# Patient Record
Sex: Male | Born: 2005 | Hispanic: Yes | Marital: Single | Smoking: Never smoker
Health system: Southern US, Community
[De-identification: ages and names within clinical notes are randomized; demographics above are authoritative.]

---
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
## 2024-02-04 ENCOUNTER — Encounter: Payer: Self-pay | Admitting: Emergency Medicine

## 2024-02-04 ENCOUNTER — Other Ambulatory Visit: Payer: Self-pay

## 2024-02-04 ENCOUNTER — Emergency Department
Admission: EM | Admit: 2024-02-04 | Discharge: 2024-02-04 | Disposition: A | Discharge status: Home / Self Care | Payer: Medicaid Other | Attending: Emergency Medicine | Admitting: Emergency Medicine

## 2024-02-04 DIAGNOSIS — R002 Palpitations: Secondary | ICD-10-CM | POA: Insufficient documentation

## 2024-02-04 DIAGNOSIS — D72829 Elevated white blood cell count, unspecified: Secondary | ICD-10-CM | POA: Insufficient documentation

## 2024-02-04 LAB — CBC WITH DIFFERENTIAL/PLATELET
Abs Immature Granulocytes: 0.04 10*3/uL (ref 0.00–0.07)
Basophils Absolute: 0 10*3/uL (ref 0.0–0.1)
Basophils Relative: 0 %
Eosinophils Absolute: 0.1 10*3/uL (ref 0.0–0.5)
Eosinophils Relative: 1 %
HCT: 42.9 % (ref 39.0–52.0)
Hemoglobin: 14.6 g/dL (ref 13.0–17.0)
Immature Granulocytes: 0 %
Lymphocytes Relative: 21 %
Lymphs Abs: 2.7 10*3/uL (ref 0.7–4.0)
MCH: 31.3 pg (ref 26.0–34.0)
MCHC: 34 g/dL (ref 30.0–36.0)
MCV: 92.1 fL (ref 80.0–100.0)
Monocytes Absolute: 0.9 10*3/uL (ref 0.1–1.0)
Monocytes Relative: 7 %
Neutro Abs: 9.2 10*3/uL — ABNORMAL HIGH (ref 1.7–7.7)
Neutrophils Relative %: 71 %
Platelets: 322 10*3/uL (ref 150–400)
RBC: 4.66 MIL/uL (ref 4.22–5.81)
RDW: 11.9 % (ref 11.5–15.5)
WBC: 13 10*3/uL — ABNORMAL HIGH (ref 4.0–10.5)
nRBC: 0 % (ref 0.0–0.2)

## 2024-02-04 LAB — BASIC METABOLIC PANEL
Anion gap: 12 (ref 5–15)
BUN: 17 mg/dL (ref 6–20)
CO2: 24 mmol/L (ref 22–32)
Calcium: 9.5 mg/dL (ref 8.9–10.3)
Chloride: 102 mmol/L (ref 98–111)
Creatinine, Ser: 0.93 mg/dL (ref 0.61–1.24)
GFR, Estimated: >60 mL/min (ref >60)
Glucose, Bld: 96 mg/dL (ref 70–99)
Potassium: 3.8 mmol/L (ref 3.5–5.1)
Sodium: 138 mmol/L (ref 135–145)

## 2024-02-04 LAB — TROPONIN I (HIGH SENSITIVITY): Troponin I (High Sensitivity): <2 ng/L (ref <18)

## 2024-02-04 NOTE — ED Triage Notes (Addendum)
Pt via POV from home. Pt c/o sudden onset of L sided non-radiating CP, palpitations, and SOB. Reports that he had 20 mins of CP PTA but denies any pain at this time. Continues to have palpitations. States he started taking Tramadol the night before for a previous injury but states he did not have these symptoms the first time he took it. Denies cardiac hx. Pt is A&Ox4 and NAD, ambulatory to room.

## 2024-02-04 NOTE — Discharge Instructions (Signed)
Please continue to monitor your symptoms at home.  Laboratory workup was reassuring today.  Please follow-up with your primary care provider as needed

## 2024-02-04 NOTE — ED Provider Notes (Signed)
[facility] Provider Note    Event Date/Time   First MD Initiated Contact with Patient 02/04/24 [phone]     (approximate)   History   Palpitations   HPI Jeffrey Griffith is a 18 y.o. male with history of depression and anxiety presenting today for palpitations.  Patient states he was taking tramadol to help with back pain that he was prescribed.  Afterwards felt like he was having palpitations and his heart was racing.  Felt very anxious and thought he was having shortness of breath.  Symptoms are very self-limited and have resolved before he got here.  Denied nausea, vomiting, abdominal pain.  Denies any symptoms similar to this.  Thinks he was having a panic attack.  No prior history of anxiety but chart review does show prior history of depression.     Physical Exam   Triage Vital Signs: ED Triage Vitals  Encounter Vitals Group     BP      Systolic BP Percentile      Diastolic BP Percentile      Pulse      Resp      Temp      Temp src      SpO2      Weight      Height      Head Circumference      Peak Flow      Pain Score      Pain Loc      Pain Education      Exclude from Growth Chart     Most recent vital signs: Vitals:   02/04/24 0430 02/04/24 0500  BP: (!) 144/86   Pulse: 93 89  Resp: (!) 28 (!) 22  Temp:    SpO2: 100% 100%    Physical Exam: I have reviewed the vital signs and nursing notes. General: Awake, alert, no acute distress.  Nontoxic appearing. Head:  Atraumatic, normocephalic.   ENT:  EOM intact, PERRL. Oral mucosa is pink and moist with no lesions. Neck: Neck is supple with full range of motion, No meningeal signs. Cardiovascular:  RRR, No murmurs. Peripheral pulses palpable and equal bilaterally. Respiratory:  Symmetrical chest wall expansion.  No rhonchi, rales, or wheezes.  Good air movement throughout.  No use of accessory muscles.   Musculoskeletal:  No cyanosis or edema. Moving extremities with full  ROM Abdomen:  Soft, nontender, nondistended. Neuro:  GCS 15, moving all four extremities, interacting appropriately. Speech clear. Psych:  Calm, appropriate.   Skin:  Warm, dry, no rash.    ED Results / Procedures / Treatments   Labs (all labs ordered are listed, but only abnormal results are displayed) Labs Reviewed  CBC WITH DIFFERENTIAL/PLATELET - Abnormal; Notable for the following components:      Result Value   WBC 13.0 (*)    Neutro Abs 9.2 (*)    All other components within normal limits  BASIC METABOLIC PANEL  TROPONIN I (HIGH SENSITIVITY)     EKG My EKG interpretation: Rate of 96, normal sinus rhythm, normal axis, normal intervals.  T wave inversions present in lead III and aVF but no other specific ischemic findings present such as ST elevations or depressions   RADIOLOGY    PROCEDURES:  Critical Care performed: No  Procedures   MEDICATIONS ORDERED IN ED: Medications - No data to display   IMPRESSION / MDM / ASSESSMENT AND PLAN / ED COURSE  I reviewed the triage vital signs and the  nursing notes.                              Differential diagnosis includes, but is not limited to, palpitations, cardiac arrhythmia, ACS, anxiety, medication side effect  Patient's presentation is most consistent with acute complicated illness / injury requiring diagnostic workup.  Patient is a 18 year old male presenting today for palpitations.  Symptoms have resolved by the time of arrival here.  EKG without any obvious cardiac arrhythmias.  There are T wave inversions present in lead III and aVF but otherwise nonspecific.  Troponin negative.  No concern for ACS with essentially no chest pain and more palpitation symptoms.  CBC with mild leukocytosis but no infectious symptoms.  BMP unremarkable.  Patient was observed in the ED for 2 hours with no recurrence of symptoms.  Stable for discharge as palpitations were self-limited and may have a component of anxiety related to it.   Patient told to follow-up with PCP and given strict return precautions.  They were agreeable with plan.  The patient is on the cardiac monitor to evaluate for evidence of arrhythmia and/or significant heart rate changes.     FINAL CLINICAL IMPRESSION(S) / ED DIAGNOSES   Final diagnoses:  Palpitations     Rx / DC Orders   ED Discharge Orders     None        Note:  This document was prepared using Dragon voice recognition software and may include unintentional dictation errors.   Janith Lima, MD 02/04/24 [phone]
